# Patient Record
Sex: Male | Born: 1965
Health system: Western US, Academic
[De-identification: ages and names within clinical notes are randomized; demographics above are authoritative.]

## PROBLEM LIST (undated history)

## (undated) DIAGNOSIS — I10 Essential (primary) hypertension: Secondary | ICD-10-CM

## (undated) HISTORY — DX: Essential (primary) hypertension: I10

## (undated) HISTORY — PX: ELBOW SURGERY: SHX618

## (undated) HISTORY — PX: WISDOM TOOTH EXTRACTION: SHX21

---

## 1999-11-26 ENCOUNTER — Encounter: Admission: RE | Admit: 1999-11-26 | Discharge: 1999-11-26 | Payer: Self-pay | Admitting: *Deleted

## 1999-11-26 ENCOUNTER — Encounter: Payer: Self-pay | Admitting: *Deleted

## 2004-10-02 ENCOUNTER — Ambulatory Visit: Payer: Self-pay | Admitting: Cardiology

## 2006-12-25 ENCOUNTER — Ambulatory Visit: Payer: Self-pay | Admitting: Cardiology

## 2009-05-20 ENCOUNTER — Emergency Department (HOSPITAL_COMMUNITY): Admission: EM | Admit: 2009-05-20 | Discharge: 2009-05-20 | Payer: Self-pay | Admitting: Emergency Medicine

## 2011-03-08 NOTE — Assessment & Plan Note (Signed)
[facility]                            CARDIOLOGY OFFICE NOTE   NAME:Crutcher, MORGAN                        MRN:          [id number]  DATE:12/25/2006                            DOB:          Jul 19, 1966    HISTORY OF PRESENT ILLNESS:  Zacharius is a 45 year old gentleman with no  prior history of cardiac disease.  I saw him a couple of years ago for  palpitations.  These appear to be sinus tachycardia related to anxiety.  We also did risk factor modification.  Lipid profile was not markedly  abnormal.  He has smoked a few cigarettes per week, but continues to  chew tobacco regularly.  I cautioned him about stopping this entirely.  He is exercising regularly and attempting to watch his diet.   He checked his blood pressure at a drug store the other day and found it  to be 150/100.  He has not had any chest pain, dyspnea, or other  symptoms.  He wished to come in for a recheck.   CURRENT MEDICATIONS:  None.   PHYSICAL EXAMINATION:  He is well-appearing.  Not overweight.  In no  distress.  Heart rate of 80, blood pressure 130/80 and equal bilaterally.  Weight  253 pounds.  Weight is up 23 pounds over the past 3 years.  He has no jugular venous distension, thyromegaly, or lymphadenopathy.  LUNGS:  Clear to auscultation.  He has a nondisplaced point of maximal impulse.  There is a regular rate  and rhythm without murmur, rub, or gallop.  ABDOMEN:  Soft, nondistended, nontender.  There is no  hepatosplenomegaly.  No edema.   IMPRESSION/RECOMMENDATIONS:  1. Question hypertension:  Blood pressure is normal.  2. Tobacco abuse:  Cautioned complete cessation.     Salvadore Farber, MD  Electronically Signed    WED/MedQ  DD: 12/25/2006  DT: 12/25/2006  Job #: [id number]

## 2016-08-01 DIAGNOSIS — M5136 Other intervertebral disc degeneration, lumbar region: Secondary | ICD-10-CM | POA: Diagnosis not present

## 2016-08-01 DIAGNOSIS — M7062 Trochanteric bursitis, left hip: Secondary | ICD-10-CM | POA: Diagnosis not present

## 2016-12-13 DIAGNOSIS — I10 Essential (primary) hypertension: Secondary | ICD-10-CM | POA: Diagnosis not present

## 2016-12-13 DIAGNOSIS — Z6841 Body Mass Index (BMI) 40.0 and over, adult: Secondary | ICD-10-CM | POA: Diagnosis not present

## 2017-02-06 DIAGNOSIS — Z125 Encounter for screening for malignant neoplasm of prostate: Secondary | ICD-10-CM | POA: Diagnosis not present

## 2017-02-06 DIAGNOSIS — Z0000 Encounter for general adult medical examination without abnormal findings: Secondary | ICD-10-CM | POA: Diagnosis not present

## 2017-02-06 DIAGNOSIS — I10 Essential (primary) hypertension: Secondary | ICD-10-CM | POA: Diagnosis not present

## 2017-02-10 DIAGNOSIS — Z6841 Body Mass Index (BMI) 40.0 and over, adult: Secondary | ICD-10-CM | POA: Diagnosis not present

## 2017-02-10 DIAGNOSIS — R740 Nonspecific elevation of levels of transaminase and lactic acid dehydrogenase [LDH]: Secondary | ICD-10-CM | POA: Diagnosis not present

## 2017-02-10 DIAGNOSIS — Z1389 Encounter for screening for other disorder: Secondary | ICD-10-CM | POA: Diagnosis not present

## 2017-02-10 DIAGNOSIS — Z0000 Encounter for general adult medical examination without abnormal findings: Secondary | ICD-10-CM | POA: Diagnosis not present

## 2017-02-10 DIAGNOSIS — I10 Essential (primary) hypertension: Secondary | ICD-10-CM | POA: Diagnosis not present

## 2017-03-25 ENCOUNTER — Encounter: Payer: Self-pay | Admitting: Internal Medicine

## 2017-04-17 DIAGNOSIS — M9905 Segmental and somatic dysfunction of pelvic region: Secondary | ICD-10-CM | POA: Diagnosis not present

## 2017-04-17 DIAGNOSIS — M9903 Segmental and somatic dysfunction of lumbar region: Secondary | ICD-10-CM | POA: Diagnosis not present

## 2017-04-17 DIAGNOSIS — M791 Myalgia: Secondary | ICD-10-CM | POA: Diagnosis not present

## 2017-04-17 DIAGNOSIS — M9902 Segmental and somatic dysfunction of thoracic region: Secondary | ICD-10-CM | POA: Diagnosis not present

## 2017-06-03 DIAGNOSIS — H00024 Hordeolum internum left upper eyelid: Secondary | ICD-10-CM | POA: Diagnosis not present

## 2017-06-26 DIAGNOSIS — H01009 Unspecified blepharitis unspecified eye, unspecified eyelid: Secondary | ICD-10-CM | POA: Diagnosis not present

## 2017-06-26 DIAGNOSIS — H00024 Hordeolum internum left upper eyelid: Secondary | ICD-10-CM | POA: Diagnosis not present

## 2017-07-29 ENCOUNTER — Other Ambulatory Visit: Payer: Self-pay | Admitting: Internal Medicine

## 2017-07-29 DIAGNOSIS — J069 Acute upper respiratory infection, unspecified: Secondary | ICD-10-CM | POA: Diagnosis not present

## 2017-07-29 DIAGNOSIS — R221 Localized swelling, mass and lump, neck: Secondary | ICD-10-CM | POA: Diagnosis not present

## 2017-07-29 DIAGNOSIS — R591 Generalized enlarged lymph nodes: Secondary | ICD-10-CM

## 2017-07-29 DIAGNOSIS — Z6841 Body Mass Index (BMI) 40.0 and over, adult: Secondary | ICD-10-CM | POA: Diagnosis not present

## 2017-07-30 ENCOUNTER — Other Ambulatory Visit: Payer: Self-pay

## 2017-08-05 ENCOUNTER — Ambulatory Visit
Admission: RE | Admit: 2017-08-05 | Discharge: 2017-08-05 | Disposition: A | Discharge status: Home / Self Care | Payer: BLUE CROSS/BLUE SHIELD | Source: Ambulatory Visit | Attending: Internal Medicine | Admitting: Internal Medicine

## 2017-08-05 ENCOUNTER — Other Ambulatory Visit: Payer: Self-pay

## 2017-08-05 DIAGNOSIS — R591 Generalized enlarged lymph nodes: Secondary | ICD-10-CM

## 2017-08-05 DIAGNOSIS — R221 Localized swelling, mass and lump, neck: Secondary | ICD-10-CM | POA: Diagnosis not present

## 2017-08-05 MED ORDER — IOPAMIDOL (ISOVUE-300) INJECTION 61%
75.0000 mL | Freq: Once | INTRAVENOUS | Status: AC | PRN
  Administered 2017-08-05: 75 mL via INTRAVENOUS

## 2017-08-15 DIAGNOSIS — S82855A Nondisplaced trimalleolar fracture of left lower leg, initial encounter for closed fracture: Secondary | ICD-10-CM | POA: Diagnosis not present

## 2017-08-21 DIAGNOSIS — S82831A Other fracture of upper and lower end of right fibula, initial encounter for closed fracture: Secondary | ICD-10-CM | POA: Diagnosis not present

## 2017-08-25 ENCOUNTER — Encounter: Payer: Self-pay | Admitting: Internal Medicine

## 2017-08-27 DIAGNOSIS — L729 Follicular cyst of the skin and subcutaneous tissue, unspecified: Secondary | ICD-10-CM | POA: Diagnosis not present

## 2017-08-27 DIAGNOSIS — D485 Neoplasm of uncertain behavior of skin: Secondary | ICD-10-CM | POA: Diagnosis not present

## 2017-08-27 DIAGNOSIS — D225 Melanocytic nevi of trunk: Secondary | ICD-10-CM | POA: Diagnosis not present

## 2017-08-28 DIAGNOSIS — S82831D Other fracture of upper and lower end of right fibula, subsequent encounter for closed fracture with routine healing: Secondary | ICD-10-CM | POA: Diagnosis not present

## 2017-08-29 DIAGNOSIS — H01009 Unspecified blepharitis unspecified eye, unspecified eyelid: Secondary | ICD-10-CM | POA: Diagnosis not present

## 2017-08-29 DIAGNOSIS — H5213 Myopia, bilateral: Secondary | ICD-10-CM | POA: Diagnosis not present

## 2017-08-29 DIAGNOSIS — H00024 Hordeolum internum left upper eyelid: Secondary | ICD-10-CM | POA: Diagnosis not present

## 2017-09-08 DIAGNOSIS — S82831D Other fracture of upper and lower end of right fibula, subsequent encounter for closed fracture with routine healing: Secondary | ICD-10-CM | POA: Diagnosis not present

## 2017-09-23 DIAGNOSIS — S82831D Other fracture of upper and lower end of right fibula, subsequent encounter for closed fracture with routine healing: Secondary | ICD-10-CM | POA: Diagnosis not present

## 2017-09-30 ENCOUNTER — Encounter: Payer: BLUE CROSS/BLUE SHIELD | Admitting: Internal Medicine

## 2017-10-07 DIAGNOSIS — S82831D Other fracture of upper and lower end of right fibula, subsequent encounter for closed fracture with routine healing: Secondary | ICD-10-CM | POA: Diagnosis not present

## 2017-10-15 DIAGNOSIS — M6281 Muscle weakness (generalized): Secondary | ICD-10-CM | POA: Diagnosis not present

## 2017-10-15 DIAGNOSIS — M25671 Stiffness of right ankle, not elsewhere classified: Secondary | ICD-10-CM | POA: Diagnosis not present

## 2017-10-15 DIAGNOSIS — S82434S Nondisplaced oblique fracture of shaft of right fibula, sequela: Secondary | ICD-10-CM | POA: Diagnosis not present

## 2017-10-22 DIAGNOSIS — M25671 Stiffness of right ankle, not elsewhere classified: Secondary | ICD-10-CM | POA: Diagnosis not present

## 2017-10-22 DIAGNOSIS — M6281 Muscle weakness (generalized): Secondary | ICD-10-CM | POA: Diagnosis not present

## 2017-10-22 DIAGNOSIS — S82434S Nondisplaced oblique fracture of shaft of right fibula, sequela: Secondary | ICD-10-CM | POA: Diagnosis not present

## 2017-10-23 DIAGNOSIS — M25671 Stiffness of right ankle, not elsewhere classified: Secondary | ICD-10-CM | POA: Diagnosis not present

## 2017-10-27 DIAGNOSIS — S82434S Nondisplaced oblique fracture of shaft of right fibula, sequela: Secondary | ICD-10-CM | POA: Diagnosis not present

## 2017-10-27 DIAGNOSIS — M6281 Muscle weakness (generalized): Secondary | ICD-10-CM | POA: Diagnosis not present

## 2017-10-27 DIAGNOSIS — M25671 Stiffness of right ankle, not elsewhere classified: Secondary | ICD-10-CM | POA: Diagnosis not present

## 2017-10-30 DIAGNOSIS — S82434S Nondisplaced oblique fracture of shaft of right fibula, sequela: Secondary | ICD-10-CM | POA: Diagnosis not present

## 2017-10-30 DIAGNOSIS — M25671 Stiffness of right ankle, not elsewhere classified: Secondary | ICD-10-CM | POA: Diagnosis not present

## 2017-10-30 DIAGNOSIS — M6281 Muscle weakness (generalized): Secondary | ICD-10-CM | POA: Diagnosis not present

## 2017-11-03 DIAGNOSIS — M6281 Muscle weakness (generalized): Secondary | ICD-10-CM | POA: Diagnosis not present

## 2017-11-03 DIAGNOSIS — D485 Neoplasm of uncertain behavior of skin: Secondary | ICD-10-CM | POA: Diagnosis not present

## 2017-11-03 DIAGNOSIS — D2262 Melanocytic nevi of left upper limb, including shoulder: Secondary | ICD-10-CM | POA: Diagnosis not present

## 2017-11-03 DIAGNOSIS — M25671 Stiffness of right ankle, not elsewhere classified: Secondary | ICD-10-CM | POA: Diagnosis not present

## 2017-11-03 DIAGNOSIS — S82434S Nondisplaced oblique fracture of shaft of right fibula, sequela: Secondary | ICD-10-CM | POA: Diagnosis not present

## 2017-11-03 DIAGNOSIS — L720 Epidermal cyst: Secondary | ICD-10-CM | POA: Diagnosis not present

## 2017-11-05 DIAGNOSIS — S82434S Nondisplaced oblique fracture of shaft of right fibula, sequela: Secondary | ICD-10-CM | POA: Diagnosis not present

## 2017-11-05 DIAGNOSIS — M25671 Stiffness of right ankle, not elsewhere classified: Secondary | ICD-10-CM | POA: Diagnosis not present

## 2017-11-05 DIAGNOSIS — M6281 Muscle weakness (generalized): Secondary | ICD-10-CM | POA: Diagnosis not present

## 2017-11-14 DIAGNOSIS — M25671 Stiffness of right ankle, not elsewhere classified: Secondary | ICD-10-CM | POA: Diagnosis not present

## 2017-11-14 DIAGNOSIS — M6281 Muscle weakness (generalized): Secondary | ICD-10-CM | POA: Diagnosis not present

## 2017-11-14 DIAGNOSIS — S82434S Nondisplaced oblique fracture of shaft of right fibula, sequela: Secondary | ICD-10-CM | POA: Diagnosis not present

## 2017-11-17 DIAGNOSIS — M25671 Stiffness of right ankle, not elsewhere classified: Secondary | ICD-10-CM | POA: Diagnosis not present

## 2017-12-09 DIAGNOSIS — Z20828 Contact with and (suspected) exposure to other viral communicable diseases: Secondary | ICD-10-CM | POA: Diagnosis not present

## 2018-02-03 ENCOUNTER — Encounter: Payer: BLUE CROSS/BLUE SHIELD | Admitting: Internal Medicine

## 2018-02-16 DIAGNOSIS — H5213 Myopia, bilateral: Secondary | ICD-10-CM | POA: Diagnosis not present

## 2018-02-16 DIAGNOSIS — H01009 Unspecified blepharitis unspecified eye, unspecified eyelid: Secondary | ICD-10-CM | POA: Diagnosis not present

## 2018-02-16 DIAGNOSIS — H00024 Hordeolum internum left upper eyelid: Secondary | ICD-10-CM | POA: Diagnosis not present

## 2018-04-24 ENCOUNTER — Encounter: Payer: Self-pay | Admitting: Gastroenterology

## 2018-05-01 DIAGNOSIS — H5213 Myopia, bilateral: Secondary | ICD-10-CM | POA: Diagnosis not present

## 2018-05-01 DIAGNOSIS — H00024 Hordeolum internum left upper eyelid: Secondary | ICD-10-CM | POA: Diagnosis not present

## 2018-05-01 DIAGNOSIS — H01009 Unspecified blepharitis unspecified eye, unspecified eyelid: Secondary | ICD-10-CM | POA: Diagnosis not present

## 2018-05-19 ENCOUNTER — Ambulatory Visit (AMBULATORY_SURGERY_CENTER): Payer: Self-pay | Admitting: *Deleted

## 2018-05-19 VITALS — Ht 73.0 in | Wt 301.6 lb

## 2018-05-19 DIAGNOSIS — Z1211 Encounter for screening for malignant neoplasm of colon: Secondary | ICD-10-CM

## 2018-05-19 MED ORDER — SUPREP BOWEL PREP KIT 17.5-3.13-1.6 GM/177ML PO SOLN: 1.0000 | Freq: Once | ORAL | 0 refills | Status: AC

## 2018-05-19 NOTE — Progress Notes (Signed)
No egg or soy allergy known to patient  No issues with past sedation with any surgeries  or procedures, no intubation problems  No diet pills per patient No home 02 use per patient  No blood thinners per patient  Pt denies issues with constipation  No A fib or A flutter  EMMI video sent to pt's e mail  Suprep no greater than $ 50 coupon given

## 2018-06-04 ENCOUNTER — Ambulatory Visit (AMBULATORY_SURGERY_CENTER): Payer: BLUE CROSS/BLUE SHIELD | Admitting: Gastroenterology

## 2018-06-04 ENCOUNTER — Encounter: Payer: Self-pay | Admitting: Gastroenterology

## 2018-06-04 VITALS — BP 160/92 | HR 71 | Temp 98.2°F | Resp 14 | Ht 73.0 in | Wt 301.0 lb

## 2018-06-04 DIAGNOSIS — D125 Benign neoplasm of sigmoid colon: Secondary | ICD-10-CM | POA: Diagnosis not present

## 2018-06-04 DIAGNOSIS — D1779 Benign lipomatous neoplasm of other sites: Secondary | ICD-10-CM | POA: Diagnosis not present

## 2018-06-04 DIAGNOSIS — Z1211 Encounter for screening for malignant neoplasm of colon: Secondary | ICD-10-CM

## 2018-06-04 MED ORDER — SODIUM CHLORIDE 0.9 % IV SOLN: 500.0000 mL | Freq: Once | INTRAVENOUS | Status: DC

## 2018-06-04 NOTE — Progress Notes (Signed)
Called to room to assist during endoscopic procedure.  Patient ID and intended procedure confirmed with present staff. Received instructions for my participation in the procedure from the performing physician.  

## 2018-06-04 NOTE — Progress Notes (Signed)
Pt's states no medical or surgical changes since previsit or office visit. 

## 2018-06-04 NOTE — Patient Instructions (Signed)
YOU HAD AN ENDOSCOPIC PROCEDURE TODAY AT THE [facility]:   Refer to the procedure report that was given to you for any specific questions about what was found during the examination.  If the procedure report does not answer your questions, please call your gastroenterologist to clarify.  If you requested that your care partner not be given the details of your procedure findings, then the procedure report has been included in a sealed envelope for you to review at your convenience later.  YOU SHOULD EXPECT: Some feelings of bloating in the abdomen. Passage of more gas than usual.  Walking can help get rid of the air that was put into your GI tract during the procedure and reduce the bloating. If you had a lower endoscopy (such as a colonoscopy or flexible sigmoidoscopy) you may notice spotting of blood in your stool or on the toilet paper. If you underwent a bowel prep for your procedure, you may not have a normal bowel movement for a few days.  Please Note:  You might notice some irritation and congestion in your nose or some drainage.  This is from the oxygen used during your procedure.  There is no need for concern and it should clear up in a day or so.  SYMPTOMS TO REPORT IMMEDIATELY:   Following lower endoscopy (colonoscopy or flexible sigmoidoscopy):  Excessive amounts of blood in the stool  Significant tenderness or worsening of abdominal pains  Swelling of the abdomen that is new, acute  Fever of 100F or higher  For urgent or emergent issues, a gastroenterologist can be reached at any hour by calling [phone].   DIET:  We do recommend a small meal at first, but then you may proceed to your regular diet.  Drink plenty of fluids but you should avoid alcoholic beverages for 24 hours.  ACTIVITY:  You should plan to take it easy for the rest of today and you should NOT DRIVE or use heavy machinery until tomorrow (because of the sedation medicines used during the test).     FOLLOW UP: Our staff will call the number listed on your records the next business day following your procedure to check on you and address any questions or concerns that you may have regarding the information given to you following your procedure. If we do not reach you, we will leave a message.  However, if you are feeling well and you are not experiencing any problems, there is no need to return our call.  We will assume that you have returned to your regular daily activities without incident.  If any biopsies were taken you will be contacted by phone or by letter within the next 1-3 weeks.  Please call us at [phone] if you have not heard about the biopsies in 3 weeks.    SIGNATURES/CONFIDENTIALITY: You and/or your care partner have signed paperwork which will be entered into your electronic medical record.  These signatures attest to the fact that that the information above on your After Visit Summary has been reviewed and is understood.  Full responsibility of the confidentiality of this discharge information lies with you and/or your care-partner. 

## 2018-06-04 NOTE — Progress Notes (Signed)
Report to PACU, RN, vss, BBS= Clear.  

## 2018-06-04 NOTE — Op Note (Signed)
[facility] Patient Name: Randy Hunt Procedure Date: 06/04/2018 1:18 PM MRN: [id number] Endoscopist: Gerrit Heck , MD Age: 52 Referring MD:  Date of Birth: Feb 26, 1966 Gender: Male Account #: [id number] Procedure:                Colonoscopy Indications:              Screening for colorectal malignant neoplasm, This                            is the patient's first colonoscopy. No fmaily                            history of colon cancer and he is otherwise without                            active GI symptoms. Medicines:                Monitored Anesthesia Care Procedure:                Pre-Anesthesia Assessment:                           - Prior to the procedure, a History and Physical                            was performed, and patient medications and                            allergies were reviewed. The patient's tolerance of                            previous anesthesia was also reviewed. The risks                            and benefits of the procedure and the sedation                            options and risks were discussed with the patient.                            All questions were answered, and informed consent                            was obtained. Prior Anticoagulants: The patient has                            taken no previous anticoagulant or antiplatelet                            agents. ASA Grade Assessment: II - A patient with                            mild systemic disease. After reviewing the risks  and benefits, the patient was deemed in                            satisfactory condition to undergo the procedure.                           After obtaining informed consent, the colonoscope                            was passed under direct vision. Throughout the                            procedure, the patient's blood pressure, pulse, and                            oxygen saturations were monitored  continuously. The                            Colonoscope was introduced through the anus and                            advanced to the the cecum, identified by                            appendiceal orifice and ileocecal valve. The                            colonoscopy was performed without difficulty. The                            patient tolerated the procedure well. The quality                            of the bowel preparation was good. Scope In: 1:23:48 PM Scope Out: 1:46:00 PM Scope Withdrawal Time: 0 hours 11 minutes 43 seconds  Total Procedure Duration: 0 hours 22 minutes 12 seconds  Findings:                 The perianal and digital rectal examinations were                            normal.                           There was a small lipoma, 8 mm in diameter, in the                            ascending colon. There was a positive "pillow                            sign". Biopsies were taken with a cold forceps via                            bite-on-bite technique for histology. Estimated  blood loss was minimal.                           A 5 mm polyp was found in the sigmoid colon. The                            polyp was sessile. The polyp was removed with a                            cold snare. Resection and retrieval were complete.                            Estimated blood loss was minimal.                           Many small and large-mouthed diverticula were found                            in the sigmoid colon.                           The retroflexed view of the distal rectum and anal                            verge was normal and showed no anal or rectal                            abnormalities. Complications:            No immediate complications. Estimated Blood Loss:     Estimated blood loss was minimal. Impression:               - Small lipoma in the ascending colon. Biopsied.                           - One 5 mm polyp in the  sigmoid colon, removed with                            a cold snare. Resected and retrieved.                           - Diverticulosis in the sigmoid colon.                           - The distal rectum and anal verge are normal on                            retroflexion view. Recommendation:           - Patient has a contact number available for                            emergencies. The signs and symptoms of potential  delayed complications were discussed with the                            patient. Return to normal activities tomorrow.                            Written discharge instructions were provided to the                            patient.                           - Resume previous diet today.                           - Continue present medications.                           - Await pathology results.                           - Repeat colonoscopy in 5-10 years for surveillance                            based on pathology results.                           - Return to GI clinic PRN. Gerrit Heck, MD 06/04/2018 1:51:09 PM

## 2018-06-05 ENCOUNTER — Telehealth: Payer: Self-pay

## 2018-06-05 NOTE — Telephone Encounter (Signed)
  Follow up Call-  Call back number 06/04/2018  Post procedure Call Back phone  # [phone]  Permission to leave phone message Yes  Some recent data might be hidden     Patient questions:  Do you have a fever, pain , or abdominal swelling? No. Pain Score  0 *  Have you tolerated food without any problems? Yes.    Have you been able to return to your normal activities? Yes.    Do you have any questions about your discharge instructions: Diet   No. Medications  No. Follow up visit  No.  Do you have questions or concerns about your Care? No.  Actions: * If pain score is 4 or above: No action needed, pain <4.

## 2018-06-05 NOTE — Telephone Encounter (Signed)
Called [phone] and left a messaged we tried to reach pt for a follow up call. maw

## 2018-06-10 ENCOUNTER — Encounter: Payer: Self-pay | Admitting: Gastroenterology

## 2018-07-01 DIAGNOSIS — Z125 Encounter for screening for malignant neoplasm of prostate: Secondary | ICD-10-CM | POA: Diagnosis not present

## 2018-07-01 DIAGNOSIS — Z0000 Encounter for general adult medical examination without abnormal findings: Secondary | ICD-10-CM | POA: Diagnosis not present

## 2018-07-01 DIAGNOSIS — I10 Essential (primary) hypertension: Secondary | ICD-10-CM | POA: Diagnosis not present

## 2018-07-13 DIAGNOSIS — Z23 Encounter for immunization: Secondary | ICD-10-CM | POA: Diagnosis not present

## 2018-07-13 DIAGNOSIS — R7301 Impaired fasting glucose: Secondary | ICD-10-CM | POA: Diagnosis not present

## 2018-07-13 DIAGNOSIS — I10 Essential (primary) hypertension: Secondary | ICD-10-CM | POA: Diagnosis not present

## 2018-07-13 DIAGNOSIS — Z0000 Encounter for general adult medical examination without abnormal findings: Secondary | ICD-10-CM | POA: Diagnosis not present

## 2019-03-12 DIAGNOSIS — Z03818 Encounter for observation for suspected exposure to other biological agents ruled out: Secondary | ICD-10-CM | POA: Diagnosis not present

## 2019-07-22 IMAGING — CT CT NECK W/ CM
2 of 4 series · 7 of 14 positions shown, 8 images · IV contrast (iopamidol)
Comparison: None.

CLINICAL DATA: 51-year-old male with right neck mass discovered 2
weeks ago.

EXAM:
CT NECK WITH CONTRAST
TECHNIQUE: Multidetector CT imaging of the neck was performed using the
standard protocol following the bolus administration of intravenous
contrast.
CONTRAST:  75mL GLBH0H-8RR IOPAMIDOL (GLBH0H-8RR) INJECTION 61%

[Series 3: neck · axial · 0.46mm/px · z∈[-288,-172]mm · 3 of 116 slices shown]
[im 29/116  bone]
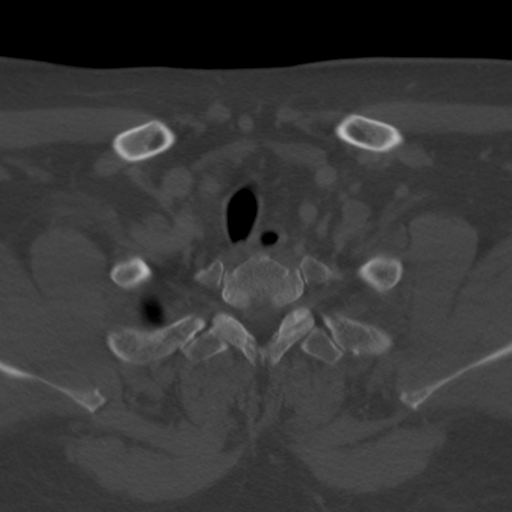
[im 58/116  bone]
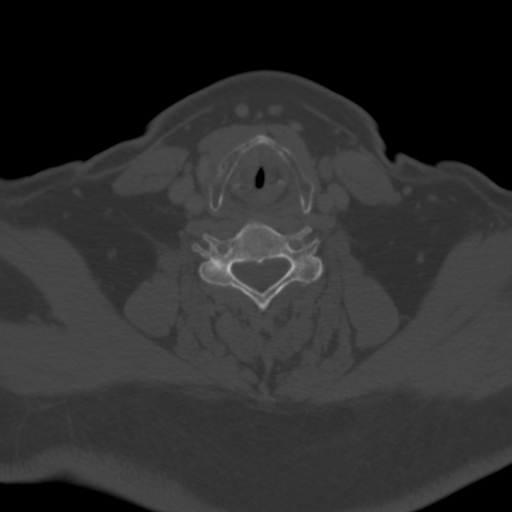
[im 87/116  bone]
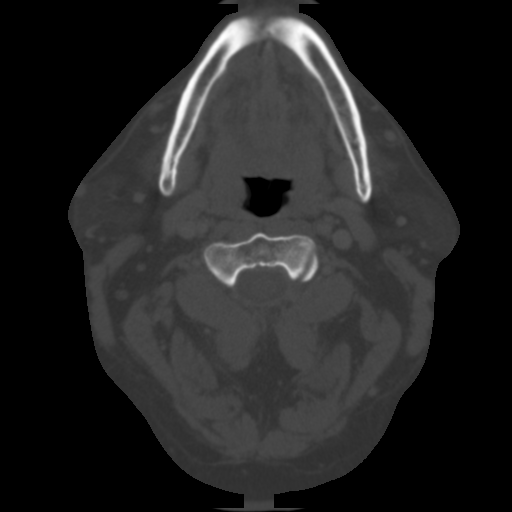

[Series 11: angled axial-oropharynx · axial · 0.44mm/px · z∈[-337,-190]mm · 4 of 128 slices shown, 5 images]
[im 26/128  soft-tissue]
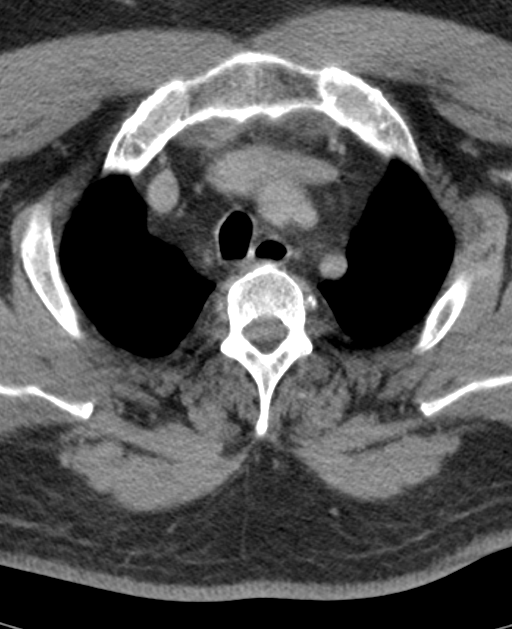
[im 26/128  bone]
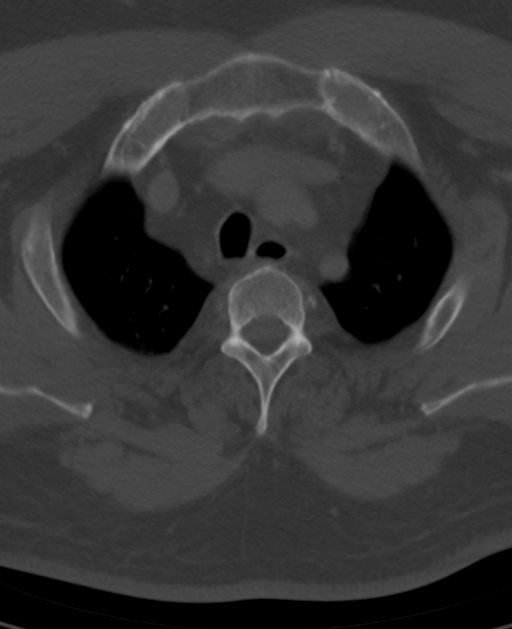
[im 51/128  bone]
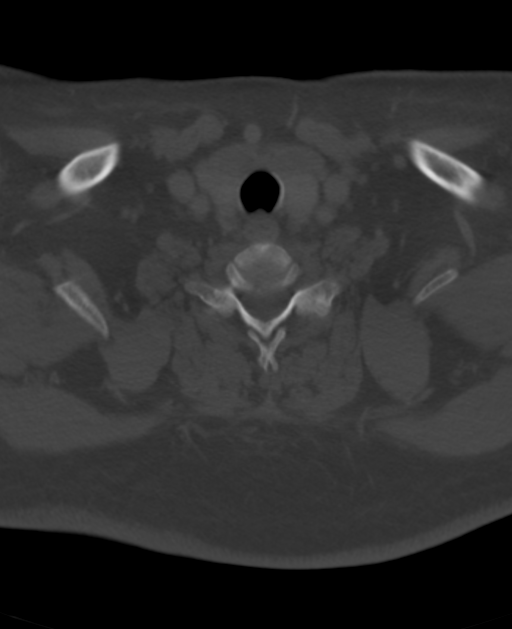
[im 77/128  bone]
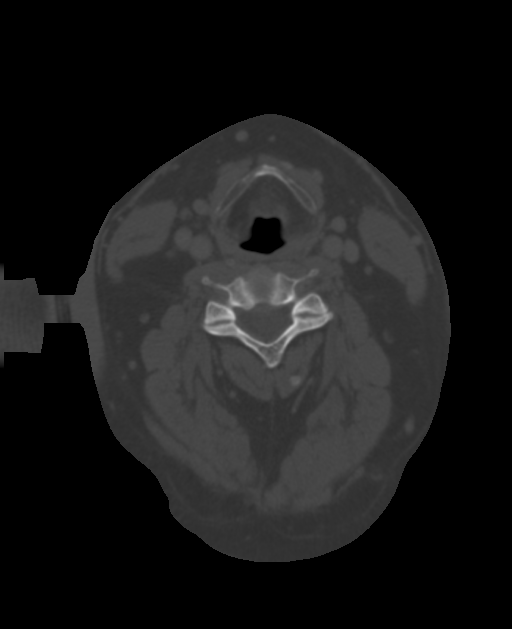
[im 102/128  bone]
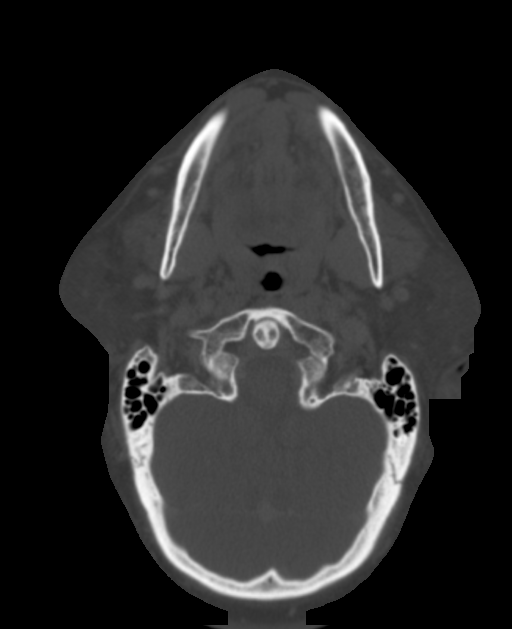

[7 of 14 positions shown; findings below may reference images not displayed]

FINDINGS: Pharynx and larynx: Mild motion artifact but pharyngeal and
laryngeal soft tissue contours are normal. Negative retropharyngeal
and parapharyngeal spaces.

Salivary glands: Negative sublingual space.

The marked area of clinical concern is seen along the right
submandibular space overlying the right submandibular gland (coronal
image 28). The submandibular glands appear symmetric and within
normal limits. No sialolithiasis. No regional inflammation. No
superimposed right submandibular region neck mass or
lymphadenopathy.

Both parotid glands also appear symmetric and normal.

Thyroid: Negative.

Lymph nodes: Bilateral cervical lymph nodes are within normal
limits. Most nodes are diminutive. A mild increased number of small
lymph nodes at the bilateral level 1b stations appears symmetric
(series 3, image 36) including in the region of palpable concern.
The largest nodes at the bilateral level IIa stations demonstrate
normal fatty hila and measure 7-8 mm short axis.

Vascular: Suboptimal intravascular contrast bolus but major arterial
structures in the neck and at the skullbase appear grossly patent.

Limited intracranial: Negative.

Visualized orbits: Not included.

Mastoids and visualized paranasal sinuses: Left maxillary sinus
cm mucous retention cyst (series 7, image 4). Otherwise clear.

Skeleton: Negative.  No acute osseous abnormality identified.

Upper chest: Negative visualized mediastinum aside from mild
lipomatosis. Visible major airways are patent and visible lung
parenchyma is negative (mild respiratory motion artifact). Visible
axillary lymph nodes are negative.
IMPRESSION: 1. No mass or abnormality identified to correspond to the palpable
area of concern which is marked near the right submandibular gland.
Recommend clinical follow-up of the palpable finding, with repeat
Neck CT (noncontrast would probably suffice) as necessary.
2. Otherwise negative neck CT.
3. Left maxillary sinus mucous retention cyst probably is
inconsequential.

## 2020-04-04 ENCOUNTER — Other Ambulatory Visit: Payer: Self-pay

## 2020-04-04 DIAGNOSIS — Z1159 Encounter for screening for other viral diseases: Secondary | ICD-10-CM

## 2020-04-04 LAB — COVID-19 DETECTION ASSAY ASYM: COVID-19 Coronavirus Result: NOT DETECTED

## 2020-04-04 LAB — RAPID COVID-19 ASSAY (POCT): COVID-19 Rapid Assay (POCT): NOT DETECTED

## 2020-04-04 NOTE — Interdisciplinary (Signed)
2 patient identifiers verified
